# Patient Record
Sex: Male | Born: 1965 | Race: White | Hispanic: No | Marital: Married
Health system: Southern US, Community
[De-identification: ages and names within clinical notes are randomized; demographics above are authoritative.]

---

## 2014-06-09 ENCOUNTER — Other Ambulatory Visit (HOSPITAL_BASED_OUTPATIENT_CLINIC_OR_DEPARTMENT_OTHER): Payer: Self-pay | Admitting: Emergency Medicine

## 2014-06-09 ENCOUNTER — Ambulatory Visit (HOSPITAL_BASED_OUTPATIENT_CLINIC_OR_DEPARTMENT_OTHER)
Admission: RE | Admit: 2014-06-09 | Discharge: 2014-06-09 | Disposition: A | Discharge status: Home / Self Care | Payer: BC Managed Care – PPO | Source: Ambulatory Visit | Attending: Emergency Medicine | Admitting: Emergency Medicine

## 2014-06-09 DIAGNOSIS — N211 Calculus in urethra: Secondary | ICD-10-CM

## 2014-06-09 DIAGNOSIS — N201 Calculus of ureter: Secondary | ICD-10-CM

## 2014-06-09 DIAGNOSIS — R109 Unspecified abdominal pain: Secondary | ICD-10-CM | POA: Diagnosis present

## 2014-06-09 DIAGNOSIS — R10A1 Flank pain, right side: Secondary | ICD-10-CM

## 2015-10-05 IMAGING — CT CT RENAL STONE PROTOCOL
2 of 4 series · 17 of 46 positions shown, 19 images · non-contrast
Comparison: None.

CLINICAL DATA: Left flank pain began last night, no hematuria, no
history of renal stones, initial evaluation(technologist verified
with referring physician that flank pain is left sided)

EXAM:
CT ABDOMEN AND PELVIS WITHOUT CONTRAST
TECHNIQUE: Multidetector CT imaging of the abdomen and pelvis was performed
following the standard protocol without IV contrast.

[Series 2: renal stone > 200 lbs 5.0 b31f · axial · 0.86mm/px · z∈[-548,-48]mm · 14 of 110 slices shown, 16 images]
[im 5/110  soft-tissue]
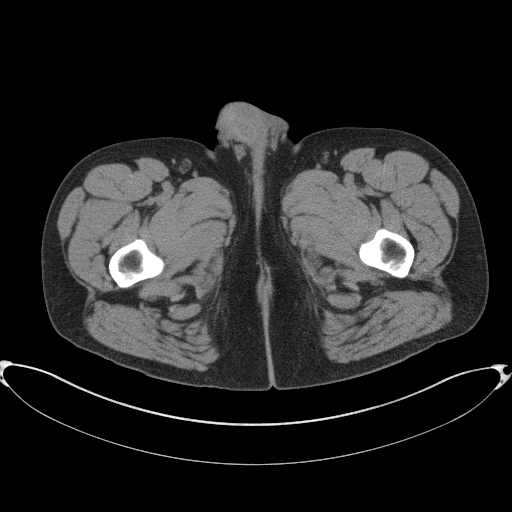
[im 5/110  bone]
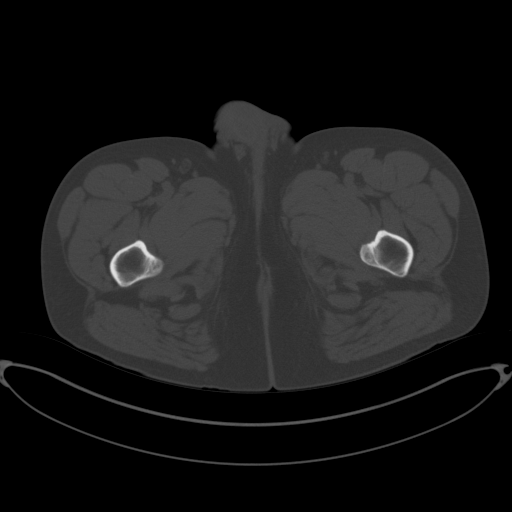
[im 14/110  soft-tissue]
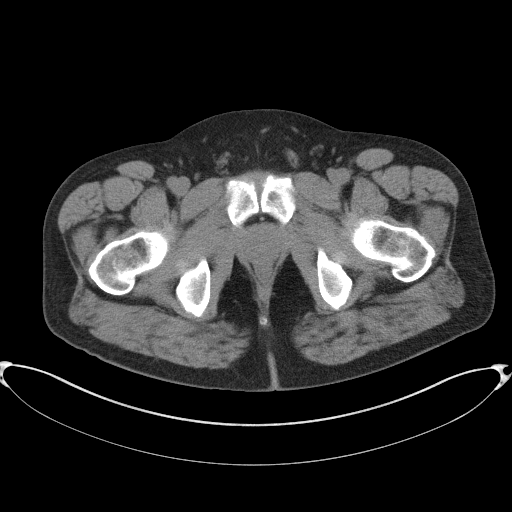
[im 22/110  soft-tissue]
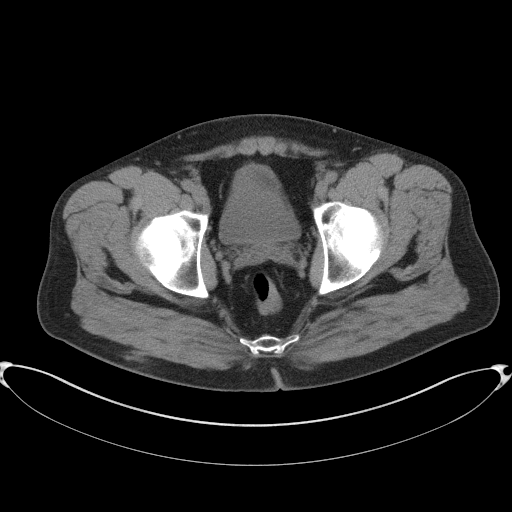
[im 31/110  soft-tissue]
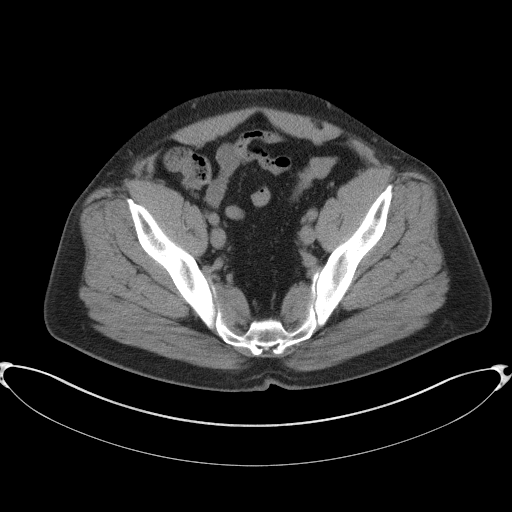
[im 35/110  soft-tissue]
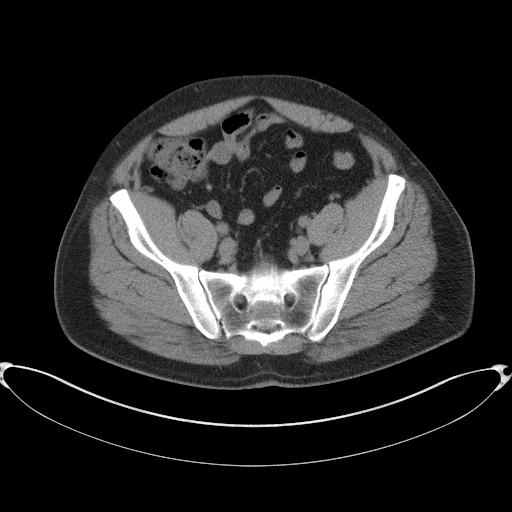
[im 44/110  soft-tissue]
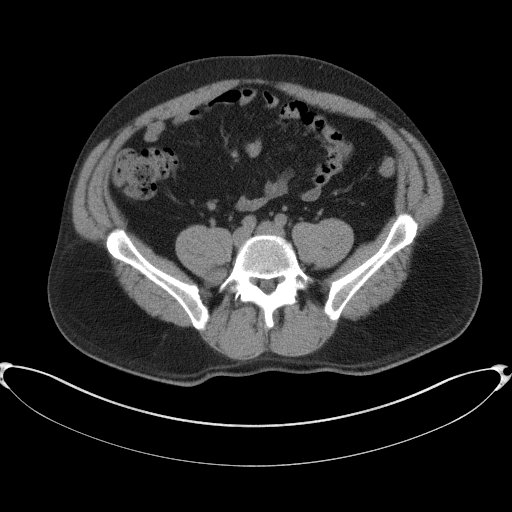
[im 53/110  soft-tissue]
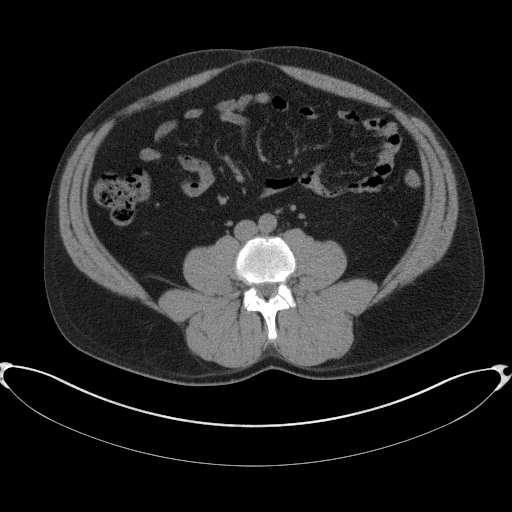
[im 57/110  soft-tissue]
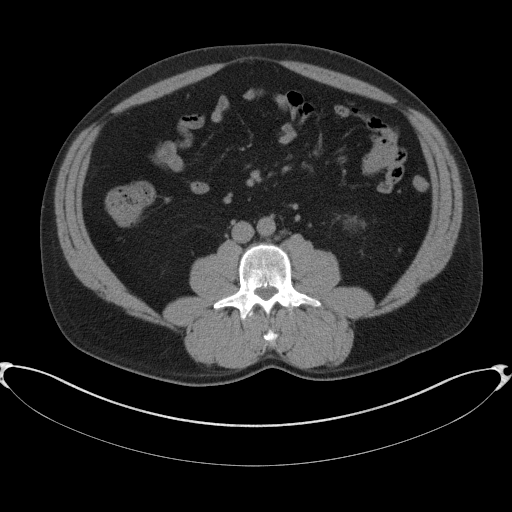
[im 66/110  soft-tissue]
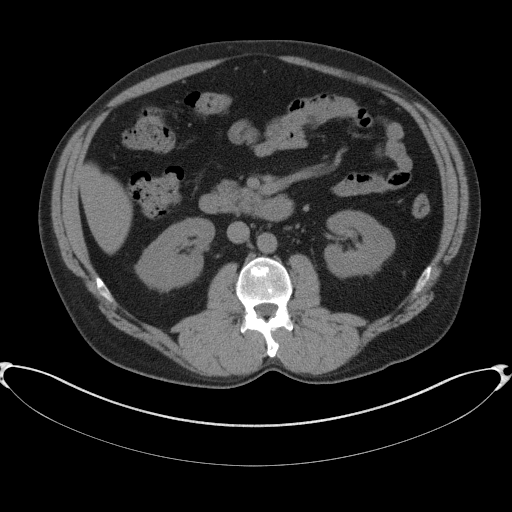
[im 66/110  bone]
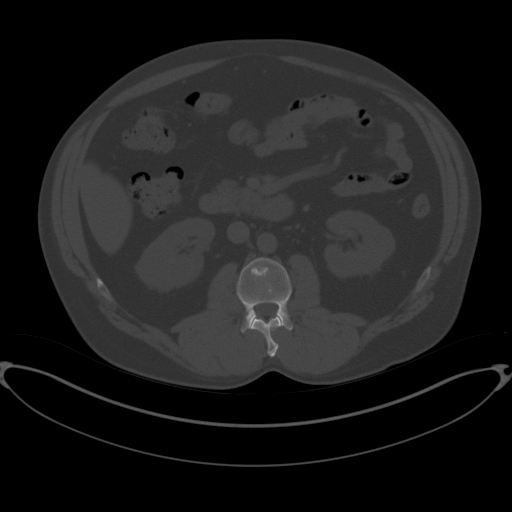
[im 75/110  soft-tissue]
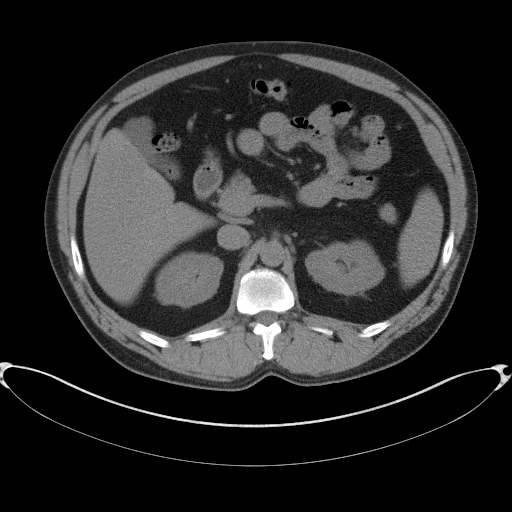
[im 83/110  soft-tissue]
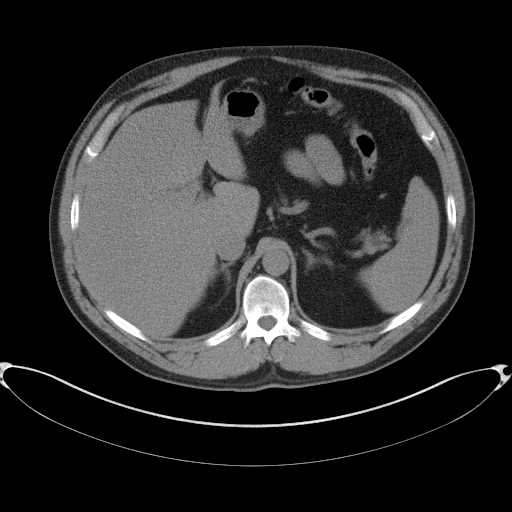
[im 88/110  soft-tissue]
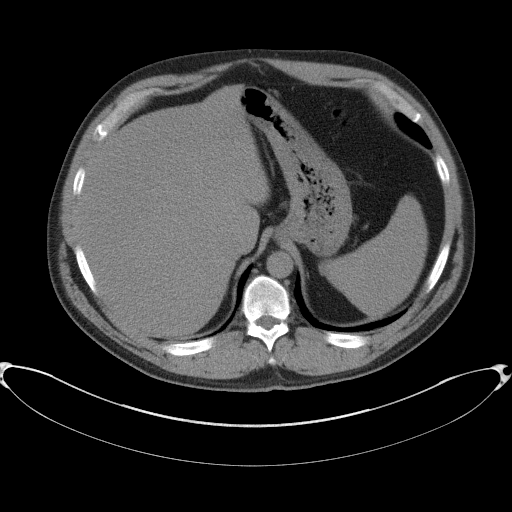
[im 96/110  soft-tissue]
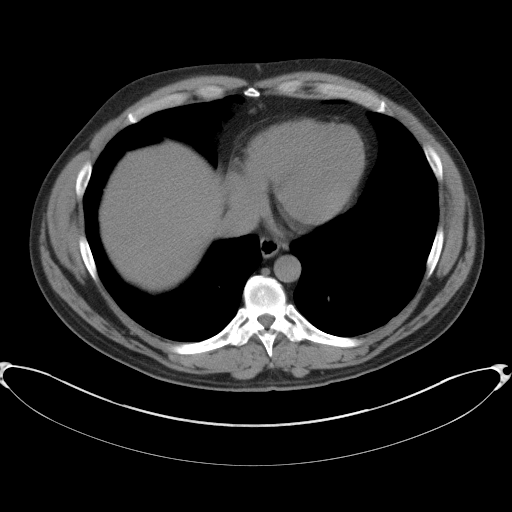
[im 105/110  soft-tissue]
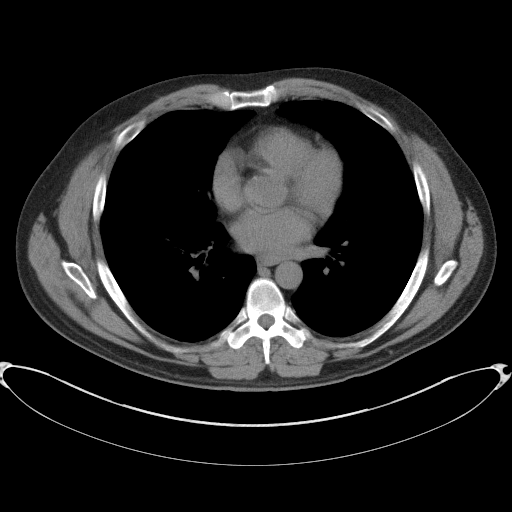

[Series 5: renal stone 3.0 coronal · coronal · 0.90mm/px · 3 of 97 slices shown]
[im 33/97  soft-tissue]
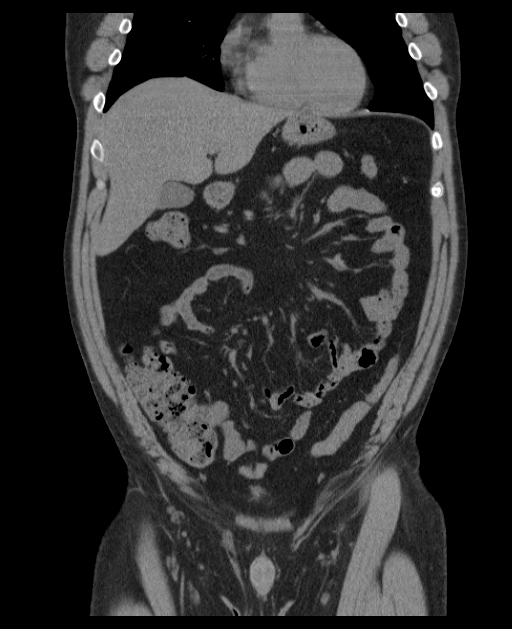
[im 43/97  soft-tissue]
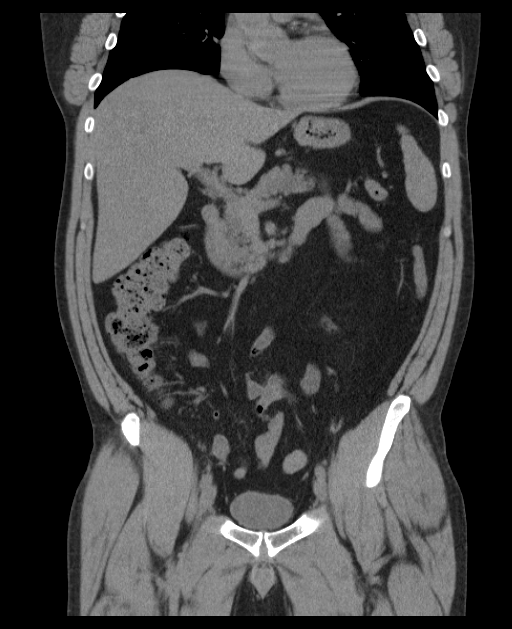
[im 54/97  soft-tissue]
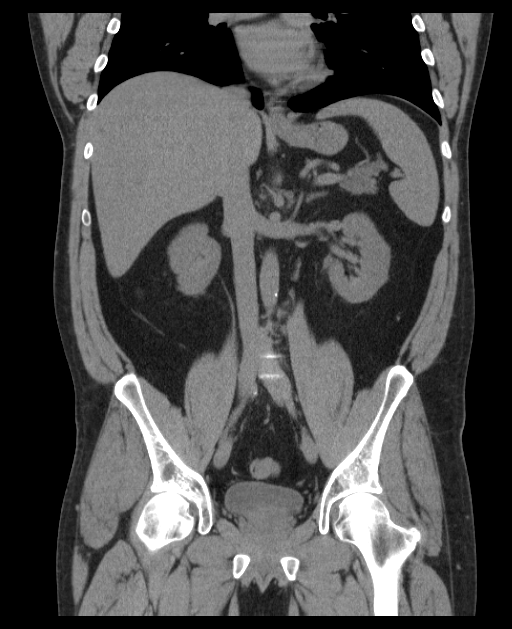

[17 of 46 positions shown; findings below may reference images not displayed]

FINDINGS: Visualized portions of the lung bases are clear. Visualized portion
of the heart is normal.

Liver, gallbladder, and spleen are normal. Pancreas is normal.
Adrenal glands are normal. Minimal calcification of the aortoiliac
vessels.

Stomach, small bowel and large bowel are normal. Appendix is normal.

There is no ascites. There are no acute musculoskeletal findings.
The bladder and reproductive organs are normal.

2 mm nonobstructing stone midpole right kidney. No hydronephrosis on
the right. On the left, there are no calculi. There is no
hydronephrosis and there is no perinephric inflammatory change.
IMPRESSION: Negative study.

## 2018-01-03 ENCOUNTER — Emergency Department (HOSPITAL_COMMUNITY): Payer: BLUE CROSS/BLUE SHIELD

## 2018-01-03 ENCOUNTER — Other Ambulatory Visit: Payer: Self-pay

## 2018-01-03 ENCOUNTER — Emergency Department (HOSPITAL_COMMUNITY)
Admission: EM | Admit: 2018-01-03 | Discharge: 2018-01-03 | Disposition: A | Discharge status: Home / Self Care | Payer: BLUE CROSS/BLUE SHIELD | Attending: Emergency Medicine | Admitting: Emergency Medicine

## 2018-01-03 DIAGNOSIS — Z79899 Other long term (current) drug therapy: Secondary | ICD-10-CM | POA: Insufficient documentation

## 2018-01-03 DIAGNOSIS — R55 Syncope and collapse: Secondary | ICD-10-CM

## 2018-01-03 DIAGNOSIS — I10 Essential (primary) hypertension: Secondary | ICD-10-CM

## 2018-01-03 DIAGNOSIS — R0789 Other chest pain: Secondary | ICD-10-CM | POA: Diagnosis not present

## 2018-01-03 DIAGNOSIS — R079 Chest pain, unspecified: Secondary | ICD-10-CM

## 2018-01-03 LAB — BASIC METABOLIC PANEL
ANION GAP: 9 (ref 5–15)
BUN: 12 mg/dL (ref 6–20)
CHLORIDE: 105 mmol/L (ref 101–111)
CO2: 26 mmol/L (ref 22–32)
Calcium: 9.2 mg/dL (ref 8.9–10.3)
Creatinine, Ser: 1.15 mg/dL (ref 0.61–1.24)
GFR calc Af Amer: >60 mL/min (ref >60)
GFR calc non Af Amer: >60 mL/min (ref >60)
Glucose, Bld: 97 mg/dL (ref 65–99)
POTASSIUM: 3.7 mmol/L (ref 3.5–5.1)
SODIUM: 140 mmol/L (ref 135–145)

## 2018-01-03 LAB — CBC
HCT: 50.2 % (ref 39.0–52.0)
HEMOGLOBIN: 17.6 g/dL — AB (ref 13.0–17.0)
MCH: 30.9 pg (ref 26.0–34.0)
MCHC: 35.1 g/dL (ref 30.0–36.0)
MCV: 88.1 fL (ref 78.0–100.0)
Platelets: 269 10*3/uL (ref 150–400)
RBC: 5.7 MIL/uL (ref 4.22–5.81)
RDW: 13.9 % (ref 11.5–15.5)
WBC: 9.5 10*3/uL (ref 4.0–10.5)

## 2018-01-03 LAB — I-STAT TROPONIN, ED
TROPONIN I, POC: 0 ng/mL (ref 0.00–0.08)
Troponin i, poc: 0.01 ng/mL (ref 0.00–0.08)

## 2018-01-03 LAB — D-DIMER, QUANTITATIVE (NOT AT ARMC)

## 2018-01-03 MED ORDER — HYDROCHLOROTHIAZIDE 25 MG PO TABS
25.0000 mg | ORAL_TABLET | Freq: Once | ORAL | Status: AC
  Administered 2018-01-03: 25 mg via ORAL
  Filled 2018-01-03: qty 1

## 2018-01-03 MED ORDER — IBUPROFEN 800 MG PO TABS
800.0000 mg | ORAL_TABLET | Freq: Once | ORAL | Status: AC
  Administered 2018-01-03: 800 mg via ORAL
  Filled 2018-01-03: qty 1

## 2018-01-03 MED ORDER — HYDROCHLOROTHIAZIDE 25 MG PO TABS
25.0000 mg | ORAL_TABLET | Freq: Every day | ORAL | 0 refills | Status: AC
Start: 2018-01-03 — End: ?

## 2018-01-03 NOTE — Discharge Instructions (Signed)

## 2018-01-03 NOTE — ED Notes (Signed)
ED Provider at bedside. 

## 2018-01-03 NOTE — ED Triage Notes (Signed)
Arrived via EMS with c/o dizziness, HTN and mid chest pain rad. between shoulder blades and into proximal aspect of LUE; known h/o HTN - currently not taking antihypertensive meds as his PCP has been unable to find one that he doesn't have adverse side effects to; EMS gave NTG x1 and ASA 324 mg PTA; pt reports NTG did decrease chest pain; currently reports head "feels funny"

## 2018-01-03 NOTE — ED Provider Notes (Signed)
[facility] EMERGENCY DEPARTMENT Provider Note   CSN: [id number] Arrival date & time: 01/03/18  1051     History   Chief Complaint Chief Complaint  Patient presents with  . Hypertension    HPI Larry Weeks is a 52 y.o. male.  Chief complaint of syncope and chest pain.  Patient has a past history of untreated hypertension and has been unable to find a blood pressure medication that he is able to tolerate. The patient was at work today and had   HPI  No past medical history on file.  There are no active problems to display for this patient.    The histories are not reviewed yet. Please review them in the "History" navigator section and refresh this SmartLink.      Home Medications    Prior to Admission medications   Medication Sig Start Date End Date Taking? Authorizing Provider  ASA-APAP-Caff Buffered [facility]) [phone] MG TABS Take 2 tablets by mouth daily as needed (Headache).   Yes [provider]  ibuprofen (ADVIL,MOTRIN) 200 MG tablet Take 800 mg by mouth every 6 (six) hours as needed for headache or moderate pain.   Yes [provider]  loratadine-pseudoephedrine (CLARITIN-D 12-HOUR) 5-120 MG tablet Take 1 tablet by mouth 2 (two) times daily as needed for allergies.   Yes [provider]  omeprazole (PRILOSEC) 40 MG capsule Take 40 mg by mouth daily as needed. 12/05/17  Yes [provider]  tamsulosin (FLOMAX) 0.4 MG CAPS capsule Take 0.4 mg by mouth daily as needed. 12/31/17  Yes [provider]  tetrahydrozoline 0.05 % ophthalmic solution Place 1 drop into both eyes daily as needed (Dry and Itchy Eyes).   Yes [provider]    Family History No family history on file.  Social History Social History   Tobacco Use  . Smoking status: Not on file  Substance Use Topics  . Alcohol use: Not on file  . Drug use: Not on file     Allergies   Patient has no allergy information on  record.   Review of Systems Review of Systems  Ten systems reviewed and are negative for acute change, except as noted in the HPI.   Physical Exam Updated Vital Signs BP (!) 162/86 (BP Location: Left Arm)   Pulse 74   Temp 98.1 F (36.7 C) (Oral)   Resp 12   SpO2 97%   Physical Exam  Constitutional: He is oriented to person, place, and time. He appears well-developed and well-nourished. No distress.  HENT:  Head: Normocephalic and atraumatic.  Eyes: Pupils are equal, round, and reactive to light. Conjunctivae and EOM are normal. No scleral icterus.  Neck: Normal range of motion. Neck supple.  Cardiovascular: Normal rate, regular rhythm and normal heart sounds.  Pulmonary/Chest: Effort normal and breath sounds normal. No stridor. No respiratory distress. He has no wheezes.  Abdominal: Soft. He exhibits no distension. There is no tenderness.  Musculoskeletal: He exhibits no edema or deformity.  Neurological: He is alert and oriented to person, place, and time.  Skin: Skin is warm and dry. Capillary refill takes less than 2 seconds. He is not diaphoretic.  Psychiatric: His behavior is normal.  Nursing note and vitals reviewed.   . ED Treatments / Results  Labs (all labs ordered are listed, but only abnormal results are displayed) Labs Reviewed  CBC - Abnormal; Notable for the following components:      Result Value   Hemoglobin 17.6 (*)  All other components within normal limits  BASIC METABOLIC PANEL  D-DIMER, QUANTITATIVE (NOT AT [facility])  I-STAT TROPONIN, ED  CBG MONITORING, ED    EKG EKG Interpretation  Date/Time:  Friday Jan 03 2018 10:54:24 EDT Ventricular Rate:  77 PR Interval:  160 QRS Duration: 72 QT Interval:  378 QTC Calculation: 427 R Axis:   -6 Text Interpretation:  Normal sinus rhythm Normal ECG No old tracing to compare Confirmed by Shaune Pollack [phone]) on 01/03/2018 3:33:37 PM   Radiology Dg Chest 2 View  Result Date: 01/03/2018 CLINICAL  DATA:  Hypertension, mid chest pain radiating between shoulder blades EXAM: CHEST - 2 VIEW COMPARISON:  None FINDINGS: Normal heart size, mediastinal contours, and pulmonary vascularity. Subsegmental atelectasis at lingula. Minimal bronchitic changes. No acute infiltrate, pleural effusion or pneumothorax. No acute osseous findings. IMPRESSION: Minimal bronchitic changes and lingular subsegmental atelectasis. Electronically Signed   By: Ulyses Southward M.D.   On: 01/03/2018 11:53   Ct Head Wo Contrast  Result Date: 01/03/2018 CLINICAL DATA:  Ct head wo, c/o dizziness, HTN and mid chest pain rad. between shoulder blades and into proximal aspect of LUE; known h/o HTN EXAM: CT HEAD WITHOUT CONTRAST TECHNIQUE: Contiguous axial images were obtained from the base of the skull through the vertex without intravenous contrast. COMPARISON:  None. FINDINGS: Brain: No evidence of acute infarction, hemorrhage, hydrocephalus, extra-axial collection or mass lesion/mass effect. Vascular: No hyperdense vessel or unexpected calcification. Skull: Normal. Negative for fracture or focal lesion. Sinuses/Orbits: Normal globes orbits. Sinuses and mastoid air cells are clear. Other: None. IMPRESSION: Normal unenhanced CT scan of the brain. Electronically Signed   By: Amie Portland M.D.   On: 01/03/2018 12:23    Procedures Procedures (including critical care time)  Medications Ordered in ED Medications - No data to display   Initial Impression / Assessment and Plan / ED Course  I have reviewed the triage vital signs and the nursing notes.  Pertinent labs & imaging results that were available during my care of the patient were reviewed by me and considered in my medical decision making (see chart for details).  Clinical Course as of Jan 04 1551  Fri Jan 03, 2018  1540 Orthostatics negative   [AH]  1541 Ct / cxr negative   [AH]  1541 EKG normal   [AH]  1541 Labs reviewed without sig abnormality. Hgb elevated. Patient is a  former smoker.   [AH]    Clinical Course User Index [AH] Arthor Captain, PA-C   Patient with syncope/ chest pain.  EKG Normal and heart score is 3.  Negative d-dimer and I doubt dissection as this would be very improbable with a negative d-dimer.  He has no chest pain at this time.  Patient does admit that he had a work-up for chest pain in the [facility] about a year ago with negative stress test. Will obtain 2nd troponin. If negative patient has close OP follow up and may be discharged.  Final Clinical Impressions(s) / ED Diagnoses   Final diagnoses:  Syncope, unspecified syncope type    ED Discharge Orders    None       Arthor Captain, PA-C 01/03/18 1607    Shaune Pollack, MD 01/04/18 [phone]

## 2018-01-03 NOTE — ED Notes (Signed)
Pt returned from xray

## 2018-01-03 NOTE — ED Notes (Signed)
Pt complaining of increase in cp

## 2018-01-03 NOTE — ED Notes (Signed)
Patient feels dizzy while he is standing.

## 2018-01-03 NOTE — ED Notes (Signed)
Pt verbalized understanding of discharge instructions.
